# Patient Record
Sex: Male | Born: 1955 | Race: White | Hispanic: No | Marital: Single | Smoking: Current every day smoker
Health system: Southern US, Community
[De-identification: ages and names within clinical notes are randomized; demographics above are authoritative.]

## PROBLEM LIST (undated history)

## (undated) DIAGNOSIS — I2510 Atherosclerotic heart disease of native coronary artery without angina pectoris: Secondary | ICD-10-CM

## (undated) DIAGNOSIS — I10 Essential (primary) hypertension: Secondary | ICD-10-CM

## (undated) DIAGNOSIS — I219 Acute myocardial infarction, unspecified: Secondary | ICD-10-CM

## (undated) DIAGNOSIS — E7800 Pure hypercholesterolemia, unspecified: Secondary | ICD-10-CM

## (undated) HISTORY — PX: CORONARY ANGIOPLASTY WITH STENT PLACEMENT: SHX49

## (undated) HISTORY — PX: CARDIAC CATHETERIZATION: SHX172

---

## 2002-07-06 ENCOUNTER — Encounter: Payer: Self-pay | Admitting: Emergency Medicine

## 2002-07-06 ENCOUNTER — Emergency Department (HOSPITAL_COMMUNITY): Admission: EM | Admit: 2002-07-06 | Discharge: 2002-07-06 | Payer: Self-pay | Admitting: Emergency Medicine

## 2005-09-18 ENCOUNTER — Ambulatory Visit: Payer: Self-pay | Admitting: Cardiology

## 2005-09-18 ENCOUNTER — Inpatient Hospital Stay (HOSPITAL_COMMUNITY): Admission: EM | Admit: 2005-09-18 | Discharge: 2005-09-24 | Payer: Self-pay | Admitting: *Deleted

## 2005-10-16 ENCOUNTER — Ambulatory Visit: Payer: Self-pay | Admitting: Cardiology

## 2005-10-17 ENCOUNTER — Ambulatory Visit: Payer: Self-pay | Admitting: Cardiology

## 2005-10-17 ENCOUNTER — Ambulatory Visit: Payer: Self-pay

## 2005-11-08 ENCOUNTER — Ambulatory Visit: Payer: Self-pay | Admitting: Cardiology

## 2005-12-02 ENCOUNTER — Ambulatory Visit: Payer: Self-pay

## 2005-12-18 ENCOUNTER — Ambulatory Visit: Payer: Self-pay | Admitting: Cardiology

## 2006-07-02 ENCOUNTER — Ambulatory Visit: Payer: Self-pay | Admitting: Cardiology

## 2006-08-27 ENCOUNTER — Ambulatory Visit: Payer: Self-pay | Admitting: Cardiology

## 2010-08-21 NOTE — Assessment & Plan Note (Signed)
[name]                            CARDIOLOGY OFFICE NOTE   NAME:[name], [name]                            MRN:          [id number]  DATE:08/27/2006                            DOB:          02-Apr-1956    [name] is a pleasant gentleman who has a history of coronary disease.  He has had previous PCI of the circumflex, ramus intermedius and LAD.  This occurred in June of 2007.  Since I last saw him, he is doing well  with no dyspnea, chest pain, palpitations, or syncope.  He does continue  to smoke.  He does not exercise routinely and is not clearly following a  diet.   MEDICATIONS:  1. Aspirin 325 mg p.o. daily.  2. Plavix 75 mg p.o. daily.  3. Zantac.  4. Lipitor 40 mg p.o. daily.  5. Lisinopril 40 mg p.o. daily.  6. Toprol 50 mg p.o. daily.   PHYSICAL EXAM:  Blood pressure 161/97, pulse 77.  He weighs 183 pounds.  HEENT:  Normal.  NECK:  Supple.  CHEST:  Clear.  CARDIOVASCULAR:  Regular rate and rhythm.  ABDOMEN:  Benign.  EXTREMITIES:  No edema.   DIAGNOSES:  1. Coronary artery disease status post multivessel percutaneous      coronary intervention - we will continue with his aspirin and      Plavix, as well as his Statin, beta blocker, and ACE inhibitor.  2. Tobacco abuse - we again discussed the importance of discontinuing      this.  3. Hypertension - his blood pressure is elevated today and I have      asked him to being Norvasc 5 mg p.o. daily in addition to his other      medications.  We will increase this as needed.  4. Hyperlipidemia - we will have his most recent lipids and liver      forwarded to [place] from Dr. Landry [name] office so that we can adjust his      medications.  Our goal LDL should be less than 70 given his history      of coronary artery disease.  We will also have his most recent BMET      forwarded to [place] from Dr. Landry [name] office so that we can follow his      potassium and renal function, given his diuretics and ACE  inhibitor      use.  5. Risk factor modification - we discussed the importance of diet and      exercise.   I will see him back in 3 months for blood pressure check.     [name] Frieze [name] Som, MD, [facility]  Electronically Signed    BSC/MedQ  DD: 08/27/2006  DT: 08/27/2006  Job #: [id number]   cc:   [name]

## 2010-08-24 NOTE — Assessment & Plan Note (Signed)
[facility]                              CARDIOLOGY OFFICE NOTE   NAME:Spong, Payton                            MRN:          [id number]  DATE:11/08/2005                            DOB:          10/26/1955    Mr. Seoane is a pleasant 55 year old gentleman who recently had multi-vessel  PCI. Since I last saw him he is having episodes of weakness and dizziness  at times that he attributes to low blood pressure.  He did have his blood  pressure checked at work when one of these episodes happened and his  systolic blood pressure was 98.  He has not had other neurological symptoms  nor has he had chest pain, shortness of breath or syncope.  He does complain  of some pain in his calves with ambulation since beginning his exercise  program.   MEDICATIONS:  Aspirin 325 mg p.o. daily, Plavix 75 mg p.o. daily, Zantac,  Lipitor 40 mg daily, Lisinopril 40 mg p.o. daily, hydrochlorothiazide 12.5  mg p.o. daily, Toprol 75 mg p.o. daily and potassium 10 mEq p.o. daily.   PHYSICAL EXAMINATION:  VITAL SIGNS:  Blood pressure 112/68, pulse is 75. He  weighs 191 pounds.  NECK:  Supple.  CHEST:  Clear.  CARDIOVASCULAR EXAM:  Regular rate and rhythm.  EXTREMITIES:  No edema.   His carotid Doppler's from October 17, 2005 showed no significant stenosis  bilaterally.   DISCHARGE DIAGNOSES:  1.  Coronary artery disease status post recent subendocardial myocardial      infarction with percutaneous coronary intervention of the obtuse      marginal, ramus intermedius and left anterior descending.  2.  Tobacco abuse.  3.  Hypertension.  4.  Hyperlipidemia.   PLAN:  Mr. Minion is complaining of dizziness and low blood pressure. I have  asked him to decrease his Toprol to 50 mg p.o. daily (this should help with  his blood pressure and also he is complaining of some fatigue which may be  related to his beta blockade).  We will also discontinue his  hydrochlorothiazide and  potassium.   I will see him back in 4 weeks and we will adjust his regimen further if  needed. I would like to continue some beta blockade at this point given his  recent subendocardial myocardial infarction.  I will also schedule him to  have ABIs as he is complaining of pain in his calves with ambulation.  Finally, I had a long discussion with him today concerning risk factor  modification including discontinuing his tobacco use. He will see [place] back in  4 weeks.                              Madolyn Frieze Jens Som, MD, [facility]    BSC/MedQ  DD:  11/08/2005  DT:  11/08/2005  Job #:  [id number]   cc:   Dr. Tresa Endo in [place]

## 2010-08-24 NOTE — Assessment & Plan Note (Signed)
[facility]                              CARDIOLOGY OFFICE NOTE   NAME:[name], [name]                            MRN:          [id number]  DATE:10/16/2005                            DOB:          06-09-1955    [name] is a 55 year old gentleman who was recently admitted to [facility] with chest pain.  He ruled in for myocardial infarction with serial  enzymes.  He subsequently underwent cardiac catheterization and was found to  have significant 3-vessel coronary disease with the culprit lesion being a  high-grade lesion in the first obtuse marginal.  The patient had a bare  metal stent of the obtuse marginal.  Later during the hospitalization, he  had staged procedures with stenting to the ramus intermedius and to the mid  LAD.  Since discharge, he has done well with no dyspnea, chest pain, or  pedal edema.  He has had brief episodes of dizziness but no syncope.  This  is not positional.  This morning he had transient haziness in his left eye,  but there were no other neurological symptoms including no loss of strength  or sensation in his extremities and no dysarthria.   MEDICATIONS:  1.  Aspirin 325 mg p.o. daily.  2.  Plavix 75 mg p.o. daily.  3.  Zantac over-the-counter.  4.  Lipitor 40 mg daily.  5.  Lisinopril 40 mg p.o. daily.  6.  Hydrochlorothiazide 12.5 mg p.o. daily.  7.  Toprol 75 mg p.o. daily.  8.  Potassium 10 mEq p.o. daily.   PHYSICAL EXAMINATION:  VITAL SIGNS:  Blood pressure 115/67, pulse 78.  He  weighs 188 pounds.  NECK:  Supple, and I cannot appreciate bruits.  CHEST: Clear.  CARDIOVASCULAR: Regular rate and rhythm.  ABDOMEN:  His groin shows no hematoma and no bruit bilaterally.  I cannot  appreciate a pulsatile mass or bruit on his abdominal exam.  EXTREMITIES:  No edema.   Electrocardiogram shows a sinus rhythm at a rate of approximately 75.  The  axis is normal.  There are no ST changes noted.    DIAGNOSES:  1.  Coronary artery disease status post recent subendocardial myocardial      infarction with percutaneous coronary intervention of the obtuse      marginal and subsequent bare metal stenting of the ramus intermedius and      left anterior descending.  2.  Preserved left ventricular function.  3.  Tobacco abuse.  4.  Hypertension.  5.  Hyperlipidemia.   PLAN:  [name] is doing well from a symptomatic standpoint.  We will  continue with his aspirin, and his Plavix will need to be continued for at  least a year.  He was recently started on Lipitor. We will plan to check  lipids and liver and adjust with a goal LDL of less than 70 given his now  documented coronary artery disease.  He had transient haziness in his left  eye of uncertain etiology.  I will schedule him for carotid Dopplers to  exclude cerebrovascular disease.  We discussed the importance of  discontinuing tobacco use, and we provided a prescription for Chantix today.  He has begun an exercise program, and we will continue with that.  He will  also continue with a low-cholesterol diet.  We will see him back in  approximately 6 months.                              [name] [name] Som, MD, [facility]    BSC/MedQ  DD:  10/16/2005  DT:  10/16/2005  Job #:  [id number]   cc:   [name], M.D.

## 2010-08-24 NOTE — Discharge Summary (Signed)
[name]                   ACCOUNT NO.:  [id number]   MEDICAL RECORD NO.:  [id number]          PATIENT TYPE:  INP   LOCATION:  6525                         FACILITY:  [facility]   PHYSICIAN:  Arturo Morton. Riley Kill, M.D. [facility] OF BIRTH:  Sep 08, 1955   DATE OF ADMISSION:  09/18/2005  DATE OF DISCHARGE:  09/24/2005                                 DISCHARGE SUMMARY   PRINCIPAL DIAGNOSIS:  1.  Non-ST elevation myocardial infarction.  2.  Three-vessel coronary artery disease.      1.  Status post bare metal stenting second obtuse marginal artery with          staged bare metal stenting of ramus intermedius and left anterior          descending.      2.  Normal left ventricular function.  3.  Status post non-sustained ventricular tachycardia.  4.  Question of obstructive sleep apnea.  5.  Mixed dyslipidemia.  6.  Transient hypokalemia.   SECONDARY DIAGNOSES:  1.  Hypertension.  2.  History of medication noncompliance.   PROCEDURES:  1.  Diagnostic coronary angiography by Dr. Nicholes Mango on June 14.  2.  Bare metal stenting of the second obtuse marginal on June 14 and staged      bare metal stenting of ramus intermedius and left anterior descending      artery on June 18, both procedures by Dr. Bonnee Quin.   REASON FOR ADMISSION:  [name] is a 55 year old male with no known history  of coronary artery disease but with several cardiac risk factors, who  initially presented to the emergency room with complaint of new onset chest  discomfort with no associated acute abnormalities on electrocardiogram.  The  patient was admitted for further evaluation and management of symptoms  worrisome for unstable angina pectoris.   HOSPITAL COURSE:  Serial cardiac markers were abnormal and consistent with  non-ST elevation myocardial infarction with a peak troponin of 4.96.  The  patient underwent initial diagnostic coronary angiography on June 14 by Dr.  Gala Romney (see report for details)  revealing diffuse three-vessel coronary  artery disease with high grade culprit lesion in the first obtuse marginal  artery.  Left ventricular function was normal.  Distal aortogram revealed no  significant atherosclerosis.   The patient underwent same day percutaneous intervention by Dr. Bonnee Quin  with bare metal stenting of a high grade second obtuse marginal artery with  no noted complications.  He then returned four days later for staged of bare  metal stenting of high grade stenoses of the ramus intermedius and mid left  anterior descending artery, again with no noted complications.   His hospital course was notable for transient asymptomatic nonsustained  ventricular tachycardia on hospital day two.  Mild hypokalemia was repleted  and normalized prior to discharge.  The patient was referred for smoking  cessation consultation and was also started on a nicotine patch.  The  patient was cleared for discharge on hospital day six in hemodynamically  stable condition.   DISCHARGE LABS:  WBC 10, hemoglobin  13.9, platelet 209.  Sodium 140,  potassium 3.6, BUN 11, creatinine 1.4 and glucose 144.  Outstanding labs  peak CPK 285/36; 12.6%; peak troponin I 4.96.  Lipid profile with total  cholesterol 179, triglyceride 88, HDL 28 and LDL 133.  Hemoglobin/hematocrit  and platelets remained normal throughout.  Liver enzymes remained. Normal  hemoglobin A1c 5.8. TSH 0.83.  Admission chest x-ray showed mild  cardiomegaly with no acute disease   DISPOSITION:  Stable.   DURATION OF DISCHARGE DICTATION:  50 minutes.   INSTRUCTIONS:  1.  The patient is referred to the cardiac catheterization discharge sheet      for full instructions.  2.  The patient is instructed to refrain from driving for two weeks.  3.  Smoking cessation is strongly advised.  4.  The patient is to not return to work pending scheduled for follow-up.   DISCHARGE MEDICATIONS:  Plavix 75 mg daily, coated aspirin 325 mg  daily,  Lipitor 40 mg daily, lisinopril 40 mg daily, nicotine transdermal system 21  mg as directed, hydrochloride 12.5 daily, Toprol XL 75 mg daily, K-Dur 10  mEq daily, Nitrostat 0.4 mg as directed.   FOLLOW-UP:  The patient will establish with Dr. Olga Millers at the  [facility] in [place] on July 11 at 2:30 p.m.      Gene Serpe, P.A. [facility]      Thomas D. Riley Kill, M.D. [facility]  Electronically Signed    GS/MEDQ  D:  09/24/2005  T:  09/24/2005  Job:  [id number]   cc:   Olivia Canter, M.D.

## 2010-08-24 NOTE — H&P (Signed)
NAMELASHAWN, ORREGO                   ACCOUNT NO.:  [id number]   MEDICAL RECORD NO.:  [id number]          PATIENT TYPE:  EMS   LOCATION:  MAJO                         FACILITY:  [facility]   PHYSICIAN:  Jonelle Sidle, M.D. LHCDATE OF BIRTH:  1956-01-10   DATE OF ADMISSION:  09/18/2005  DATE OF DISCHARGE:                                HISTORY & PHYSICAL   PRIMARY CARE PHYSICIAN:  Dr. Olivia Canter in [place].   Mr. Homewood is a 55 year old Caucasian gentleman with no known history of  coronary artery disease; however, with a history of poorly controlled  hypertension, who presents today with an episode of chest discomfort.  The  chest discomfort initially started around 1:15 today.  Patient states that  he was at work.  He works for [employer].  He had a sudden  onset of tightness, substernal, localized.  He states that he was sitting at  that time.  He got up to get some water to drink.  The chest tightness then  proceeded up into his neck and throat area.  He states that he feels he  could not swallow.  He said the pressure was very intense.  It then radiated  down both arms.  He denies any shortness of breath.  He did become  lightheaded.  He states that he felt very flushed.  He had some blurred  vision with it.  He denied any nausea, vomiting, or diaphoresis.  He states  that it lasted approximately five minutes.  The chest pain eased off, but  the neck pain continued.  He states that he drank some more water when the  chest pain returned.  He then went and saw the nurse at work, and she called  911.  He states that at the worst, the chest discomfort was around 6-7.  He  currently is rating it around 3 or 4.  Currently, the patient without any  acute ST/T wave changes on the EKG; however, blood pressure at this time is  177/110.   ALLERGIES:  PENICILLIN.   MEDICATIONS:  Patient states that when he remembers to take it, he is  supposed to be on 10 mg of  lisinopril.  He takes Zantac over the counter.   PAST MEDICAL HISTORY:  1.  Hypertension x3 years.  2.  Borderline cholesterol.  3.  Noncompliance with medications.  4.  Questionable obstructive sleep apnea, per wife's description.  5.  Patient states that he had a stress test a few years ago.  Patient      reports it was negative.   SOCIAL HISTORY:  He lives in [place] with his wife.  He works for  [employer].  He has ongoing tobacco use of one pack per day  x30+ years.  No exercise.  Denies ETOH, drug, or over-medication use.  He  follows a regular diet.  Wife states he has been told to follow a low sodium  diet, but patient is not compliant.   FAMILY HISTORY:  Mother deceased at age 28 with history of COPD and  asthma.  Father had his first MI at age 63.  Since that time, he has had four more  heart attacks, the last one being when he was 55 years old and cause of  death.  He had two strokes.  He had a bypass and then had a redo bypass.  Siblings:  He has a brother who deceased at age 35 secondary to MI and a  brother deceased at age 80 secondary to an aneurysm.   REVIEW OF SYSTEMS:  Positive for headache, blurred vision, chest pain, heavy  snoring, and periods of apnea, per patient's wife.  All other systems are  negative, per patient.  CARDIOPULMONARY:  Positive for claudication  symptoms.   PHYSICAL EXAMINATION:  VITAL SIGNS:  Temp 98.7, pulse 73, respirations 20,  blood pressure 193/104.  Currently, he is 177/110.  GENERAL:  He is in no acute distress.  He is very calm and cooperative.  HEENT:  Pupils are equal, round and reactive to light.  Sclerae are clear.  Normocephalic and atraumatic.  NECK:  Supple without lymphadenopathy, bruits, or JVD.  LUNGS:  He has rhonchi in the right lobes that clear with cough.  Otherwise  decreased breath sounds throughout.  CARDIOVASCULAR:  Regular rhythm.  S1 and S2.  Pulses are 2+ and equal  without bruit.  ABDOMEN:   Soft and nontender.  Positive bowel sounds.  EXTREMITIES:  Without clubbing, cyanosis or edema.  He has 2+ DP's  bilaterally.  NEUROLOGIC:  Patient is alert and oriented x3.  Cranial nerves II-XII  grossly intact.   Chest x-ray showing cardiomegaly.   EKG at a rate of 70, sinus rhythm.  Patient has nonspecific T wave  abnormalities in the inferior lateral leads, very subtle ST depression.   LABORATORY WORK:  WBC 10.7, hemoglobin 15.5, hematocrit 44.6 with a platelet  count of 197,000.  Sodium 141, potassium 3.3, chloride 104, CO2 27, BUN 10,  creatinine 1.3 with a glucose of 106.  AST 14, ALT 13.  Cardiac markers:  Troponin less than 0.05.   Dr. Simona Huh in to examine this nice patient with chest pain in the  setting of hypertensive urgency and hypokalemia with ongoing tobacco abuse.  Patient will be admitted to step-down.  Continue treatment with aspirin,  nitroglycerin IV, beta blocker.  We will continue his ACE inhibitor,  increase the dose from 10-20 and will give patient a one time dose of 20 mg  of hydralazine here in the ER.  No anticoagulation until blood pressure more  stable.  We will cycle cardiac enzymes, check hemoglobin A1C and a lipid  panel.  The patient will need a smoking cessation consult.  Anticipate  diagnostic cardiac catheterization in the a.m.  The risks and benefits have  been discussed with the patient, and patient agrees to proceed with cardiac  catheterization.      Dorian Pod, NP      Jonelle Sidle, M.D. [facility]  Electronically Signed    MB/MEDQ  D:  09/18/2005  T:  09/18/2005  Job:  [id number]

## 2010-08-24 NOTE — Assessment & Plan Note (Signed)
[facility]                              CARDIOLOGY OFFICE NOTE   NAME:Cadieux, Tyri                            MRN:          [id number]  DATE:12/18/2005                            DOB:          1955-10-08    Mr. Shin is a very pleasant gentleman with a history of coronary disease,  status post multivessel PCI.  When I last saw him he was having episodes of  weakness and his blood pressure was running low.  We therefore discontinued  his hydrochlorothiazide and decreased his Toprol to 50 mg daily.  Since then  he feels much better.  He denies any dyspnea, chest pain or syncope.  Also  note his ABIs were normal.   MEDICATIONS:  1. Aspirin 325 mg two daily.  2. Plavix 75 mg q. day.  3. Zantac over-the-counter.  4. Lipitor 40 mg q. day.  5. Lisinopril 40 mg q. day.  6. Toprol 50 mg q. day.   PHYSICAL EXAMINATION:  VITAL SIGNS:  Today shows a blood pressure 145/86,  his pulse is 76.  He weighs 190 pounds.  NECK:  Is supple with no bruits.  CHEST:  Clear.  CARDIAC:  Shows a regular rate and rhythm.  EXTREMITIES:  Show no edema.   DIAGNOSES:  1. Coronary artery disease - status post multivessel PCI.  2. Tobacco abuse.  3. Hypertension.  4. Hyperlipidemia.   PLAN:  1. Mr. Devery is doing well from a symptomatic standpoint.  His blood      pressure is mildly increased today but we will not make changes as he      had problems with presyncope previously.  2. We again discussed the importance of discontinuing his tobacco use.  3. He will continue with his diet and exercise program.  4. Note his most recent HDL was 24 but he did not tolerate Niaspan.  5. He will see me back in 6 months.                              Madolyn Frieze Jens Som, MD, [facility]    BSC/MedQ  DD:  12/18/2005  DT:  12/19/2005  Job #:  [id number]   cc:   Pearson Forster, MD

## 2010-08-24 NOTE — Cardiovascular Report (Signed)
[name], [name]                   ACCOUNT NO.:  [id number]   MEDICAL RECORD NO.:  [id number]          PATIENT TYPE:  INP   LOCATION:  2920                         FACILITY:  [facility]   PHYSICIAN:  Arvilla Meres, M.D. LHCDATE OF BIRTH:  Aug 29, 1955   DATE OF PROCEDURE:  09/19/2005  DATE OF DISCHARGE:                              CARDIAC CATHETERIZATION   PRIMARY CARE PHYSICIAN:  Dr. Tresa Endo in [place].   CARDIOLOGIST:  Dr. Simona Huh.   PATIENT IDENTIFICATION:  [name] is a 55 year old male with no known  history of coronary disease.  He does have a history of tobacco use which is  ongoing as well as hypertension.  He was admitted with non-ST elevation  myocardial infarction and brought to the cath lab for diagnostic  angiography.   PROCEDURES PERFORMED:  1.  Selective coronary angiography.  2.  Left heart cath.  3.  Left ventriculogram.  4.  Abdominal aortogram.   DESCRIPTION OF PROCEDURE:  The risks and benefits of catheterization were  explained.  Consent was signed and placed on the chart.  A 6-French arterial  sheath was placed in the right femoral artery using a modified Seldinger  technique.  Standard preformed catheters including a Judkins JL-4, JR-4 and  angled pigtail were used for the catheterization.  All catheter exchanges  were made over a wire.  There are no apparent complications.   FINDINGS:   HEMODYNAMIC RESULTS:  Central aortic pressure is 143/88 with a mean of 112.  LV pressure is 157/10 with an LVEDP of 18.  There is no significant aortic  stenosis.   Left main was short, angiographically normal.   LAD was a long vessel that wrapped the apex.  It had moderate diffuse  disease throughout with heavy calcification proximally.  It gave off a large  branching diagonal in the proximal portion.  In the proximal to midportion  of the LAD just after the takeoff of the diagonal there is a tubular 60-70%  stenosis.  Following this, there is a 40% lesion in  the midsection.  The  distal LAD had moderate diffuse disease with no high-grade lesions.  In the  diagonal there was a 60% lesion in the midsection.   Left circumflex was a large codominant system.  It gave off  a large  branching ramus, a large OM-1, 2 small PLs and posterolaterals and a small  PDA.  There was a 30-40% lesion of the mid AV groove circ.  In the ramus  there was a 30% lesion proximally followed by a 60-70% lesion in the  midsection and a 40% lesion more distally.  In the OM-1 there is a 60%  lesion proximally and 90% hazy lesion in the mid OM-1.   The right coronary artery was a moderate size codominant vessel, gave off a  large RV branch and a moderate-sized PDA.  Once again the RCA had diffuse  moderate disease.  There is a 30% lesion proximally, a 50% lesion in the  midsection just after the takeoff of the RV branch and a 40% lesion distally  prior to the takeoff of the PDA.   Left ventriculogram done in the RAO position showed an ejection fraction of  65-70% with no regional wall motion abnormalities.  No mitral regurgitation.   Abdominal aortogram showed patent renal arteries bilaterally with moderate  aortic plaquing throughout the aortic iliac plaquing throughout.  There is  no aneurysm.   ASSESSMENT:  1.  Diffuse moderate three-vessel coronary artery disease with high-grade      culprit lesion in the obtuse marginal one.  2.  Normal left ventricular function.  3.  Severe hypertension on admission with patent renal arteries bilaterally.   PLAN/DISCUSSION:  I have reviewed the films with Dr. Riley Kill.  The plan will  be for percutaneous intervention on the OM-1 today.  We will consider  outpatient Myoview to this assess the hemodynamic significance of the  proximal mid LAD lesion.  He will obviously need aggressive risk factor  management including high dose statin and smoking cessation.      Arvilla Meres, M.D. [facility]  Electronically Signed      DB/MEDQ  D:  09/19/2005  T:  09/19/2005  Job:  [id number]   cc:   Tresa Endo, M.D.  Kathryne Sharper

## 2010-08-24 NOTE — Cardiovascular Report (Signed)
[name], [name]                   ACCOUNT NO.:  [id number]   MEDICAL RECORD NO.:  [id number]          PATIENT TYPE:  INP   LOCATION:  2920                         FACILITY:  [facility]   PHYSICIAN:  Arturo Morton. Riley Kill, M.D. [facility] OF BIRTH:  09-15-1955   DATE OF PROCEDURE:  09/19/2005  DATE OF DISCHARGE:                              CARDIAC CATHETERIZATION   INDICATIONS:  The patient is a 55 year old who presents with unstable  angina.  He underwent diagnostic catheterization demonstrating high-grade  stenosis in the OM-2.  There is a moderately high-grade stenosis in the OM1  and a moderate stenosis in the left anterior descending artery.  After  considerable discussion, we elected to recommend percutaneous intervention  of the OM-2.  This was explained to the patient and his family.  Preparations were then made for percutaneous intervention.   DESCRIPTION OF PROCEDURE:  The patient was brought to the catheterization  laboratory and prepped and draped in usual fashion.  There was an indwelling  6-French sheath.  This was exchanged for a 7-French sheath.  Bivalirudin was  given according to protocol; because of hypertension, 20 mg of labetalol was  administered.  The patient was also given intracoronary nitroglycerin.  We  used a CLS 3.5 guide to enter the left main with adequate anticoagulation  documented by ACT, a Prowater wire was placed down into the distal  circumflex and predilatation done with a 2.25 x 12 Maverick balloon.  We  then stented the lesion with 2.5 x 15 mini Vision stent.  This was taken up  to about 12 atmospheres, then post dilated.  There was an area of disease  proximal to the stent at the entry into the OM II, and after considerable  review of the studies, we elected to place an overlapping stent and  therefore a 2.5 x 23 mini Vision stent was then placed in overlapping  fashion and taken up to 14 atmospheres.  Post dilatation was then done with  a 2.75-mm  Quantum Maverick balloon with marked improvement in the appearance  of the artery.  With the vasodilatation associated with the intracoronary  nitroglycerin, also the intermediate and LAD lesions were reviewed.  I plan  to review these with Dr. Gala Romney in detail after the patient is  stabilized.  They may require later percutaneous intervention.   ANGIOGRAPHIC DATA:  The left main is free of critical disease.  The  circumflex provides an OM1 and OM2 and an AV circumflex resulting a  posterolateral branch.  The OM1 has segmental 70-80% area of disease that is  fairly smooth.  The OM2 has an 80% stenosis distally and then a probably 70-  80% stenosis proximally.  After stenting, there was marked improvement in  the appearance of the this segmental area with 38 mm of overlapping stent.  The OM1 and LAD appear unchanged.   CONCLUSION:  Successful percutaneous stenting of a OM2.   DISPOSITION:  The patient will be treated with aspirin and Plavix.  I will  review with my colleagues the status of the OM1, and  we may elect a  percutaneous intervention of this vessel as well.      Arturo Morton. Riley Kill, M.D. [facility]  Electronically Signed     TDS/MEDQ  D:  09/19/2005  T:  09/19/2005  Job:  [id number]   cc:   Arvilla Meres, M.D. [facility]  [employer]  [address]  [place]  [place] [address]

## 2010-08-24 NOTE — Assessment & Plan Note (Signed)
[facility]                            CARDIOLOGY OFFICE NOTE   NAME:[name]                            MRN:          [id number]  DATE:07/02/2006                            DOB:          Sep 02, 1955    [name] returns for followup today.  Since I last saw him he denies any  dyspnea, chest pain, palpitations, or syncope and there is no pedal  edema.  He is not exercising routinely, nor has he quit smoking.   MEDICATIONS:  1. Aspirin 325 mg p.o. daily.  2. Plavix 75 mg p.o. daily.  3. Zantac.  4. Lipitor 40 mg p.o. daily.  5. Lisinopril 40 mg p.o. daily.  6. Toprol 50 mg p.o. daily.   PHYSICAL EXAMINATION:  Shows a blood pressure 160/94 and his pulse is  72.  NECK:  Supple with no bruits.  CHEST:  Clear.  CARDIOVASCULAR:  Regular rate and rhythm.  EXTREMITIES:  Show no edema.  ELECTROCARDIOGRAM:  Shows a sinus rhythm at a rate of 70.  There are no  ST changes noted.   DIAGNOSIS:  1. Coronary artery disease, status post multi-vessel percutaneous      coronary intervention.  2. Tobacco abuse.  3. Hypertension.  4. Hyperlipidemia.   PLAN:  [name] is doing well from a symptomatic standpoint.  His blood  pressure is elevated today and I have asked him to resume his HCTZ at  12.5 mg p.o. daily.  We will check a BMET in one week to follow his  potassium and renal function.  At that time we will check lipids and  liver as well.  We will adjust his regimen as indicated.  We discussed  the importance of discontinuing his tobacco use as well as diet and  exercise.  I will see him back in 8 weeks to check his blood pressure.     Madolyn Frieze Jens Som, MD, [facility]  Electronically Signed    BSC/MedQ  DD: 07/02/2006  DT: 07/02/2006  Job #: [id number]   cc:   Olivia Canter, MD

## 2010-08-24 NOTE — Cardiovascular Report (Signed)
[name], APOLLO                   ACCOUNT NO.:  [id number]   MEDICAL RECORD NO.:  [id number]          PATIENT TYPE:  INP   LOCATION:  6525                         FACILITY:  [facility]   PHYSICIAN:  Arturo Morton. Riley Kill, M.D. [facility] OF BIRTH:  08-Dec-1955   DATE OF PROCEDURE:  09/23/2005  DATE OF DISCHARGE:                              CARDIAC CATHETERIZATION   INDICATIONS:  The patient is a 55 year old gentleman who presented with a  non-ST-elevation myocardial infarction.  He underwent diagnostic  catheterization by Dr. Gala Romney who thought that the AV circumflex was  likely the source of the ischemia.  He underwent percutaneous stenting with  overlapping non drug-eluting stents.  The patient has a history of polyps  and has had resection of polyps with some GI bleeding and two colonoscopies  in the past couple of years.  He was brought back to the catheterization  laboratory today for a relook and possible percutaneous intervention of the  intermediate and possibly the LAD.  This been discussed with the patient in  detail.  He consented to proceed.   PROCEDURES:  1.  Percutaneous stenting of the ramus intermedius for obtuse marginal-1  2.  Percutaneous stenting of the left anterior descending.   DESCRIPTION OF PROCEDURE:  The patient was brought to the catheterization  laboratory, prepped and draped in usual fashion.  Through an anterior  puncture the left femoral artery was entered a 6-French sheath was placed.  We then did a diagnostic view of the previous stent procedure and this was  widely patent and appeared to be smooth.  Additional oral clopidogrel was  administered.  Bivalirudin was given according to protocol.  ACT was checked  and found to be appropriate.  Following this, a high-torque, floppy wire was  placed down the intermediate vessel.  We carefully measured the lesion  length.  Following this, a 2.5 x 8 mini-Vision stent was placed across the  stenosis and taken up to  a minimum of 14 atmospheres.  Post dilatation was  then done with a 3-0 Quantum Maverick balloon.  There was marked improvement  in the appearance of the artery.  We then turned our attention to the LAD.  After considerable thought, it was my feeling that we should go ahead and  potentially image the LAD.  We had a difficult time getting a good IVUS  image, so we went ahead and predilated the LAD using a 2.25 x 15 Maverick  balloon and there was full expansion of the balloon.  We then placed a 2.25  x 23 mini-Vision stent and this was also deployed at about 14 atmospheres.  A2.5 Quantum Maverick balloon was then used to post dilate the stent and  there was marked improvement in the appearance of the artery.  Final  angiographic views were obtained. Because of some oozing around the left  groin and persistent hypotension despite intravenous nitroglycerin, giving  the patient his medicines, and additional labetalol in the laboratory, there  was some mild continuous oozing so we placed a 7-French sheath  in exchange  for the 6-French  sheath.  He was taken to the holding area in satisfactory  clinical condition.   ANGIOGRAPHIC DATA:  1.  The left main had some mild calcification, but is widely patent.  2.  The previously treated circumflex has about 30% narrowing throughout the      proximal vessel and leads into an OM-2.  This vessel was widely patent      with good angiographic runoff into the distal artery.  There are      overlapping stents.  3.  The ramus intermedius has an 80-90% area of stenosis leading into a      large vessel.  The vessel itself is diffusely diseased with segmental      plaquing proximally.  Following stenting, this is reduced to 0% residual      luminal narrowing across this area.  4.  The LAD has about 30% narrowing prior to the diagonal.  The diagonal      probably has about 60-70% narrowing.  Just after the diagonal, is an      area of segmental plaquing with  some calcification that is eccentric.      The most serious area of plaque does not have significant calcification      angiographically.  Following stenting, this 80-90% area of stenosis is      reduced to 0% with an excellent angiographic appearance.  There is about      30-40% narrowing distal to the stent site and multiple areas of luminal      irregularity distally.   CONCLUSION:  1.  Continued patency of the obtuse marginal stent.  2.  Successful percutaneous stenting of the ramus intermedius.  3.  Successful percutaneous stenting of the left anterior descending with      residual disease of the diagonal.   DISPOSITION:  The patient be treated medically.  Significant risk factor  reduction will be important.  He will need follow-up and this can be either  arranged in [facility] or in the [facility] in  [place].  Plavix will be needed for a minimum of a month and preferably  x1 year.      Arturo Morton. Riley Kill, M.D. [facility]  Electronically Signed     TDS/MEDQ  D:  09/23/2005  T:  09/24/2005  Job:  [id number]   cc:   Olivia Canter, M.D.

## 2013-06-16 ENCOUNTER — Institutional Professional Consult (permissible substitution): Payer: Self-pay | Admitting: Cardiology

## 2013-10-02 ENCOUNTER — Emergency Department (HOSPITAL_COMMUNITY): Payer: No Typology Code available for payment source

## 2013-10-02 ENCOUNTER — Encounter (HOSPITAL_COMMUNITY): Payer: Self-pay | Admitting: Emergency Medicine

## 2013-10-02 ENCOUNTER — Inpatient Hospital Stay (HOSPITAL_COMMUNITY)
Admission: EM | Admit: 2013-10-02 | Discharge: 2013-10-03 | DRG: 087 | Disposition: A | Discharge status: Home / Self Care | Payer: No Typology Code available for payment source | Attending: Surgery | Admitting: Surgery

## 2013-10-02 DIAGNOSIS — S0990XA Unspecified injury of head, initial encounter: Secondary | ICD-10-CM

## 2013-10-02 DIAGNOSIS — S065XAA Traumatic subdural hemorrhage with loss of consciousness status unknown, initial encounter: Secondary | ICD-10-CM

## 2013-10-02 DIAGNOSIS — I10 Essential (primary) hypertension: Secondary | ICD-10-CM | POA: Diagnosis present

## 2013-10-02 DIAGNOSIS — S8012XA Contusion of left lower leg, initial encounter: Secondary | ICD-10-CM

## 2013-10-02 DIAGNOSIS — T07XXXA Unspecified multiple injuries, initial encounter: Secondary | ICD-10-CM | POA: Diagnosis present

## 2013-10-02 DIAGNOSIS — Z9861 Coronary angioplasty status: Secondary | ICD-10-CM

## 2013-10-02 DIAGNOSIS — Z7902 Long term (current) use of antithrombotics/antiplatelets: Secondary | ICD-10-CM

## 2013-10-02 DIAGNOSIS — Z79899 Other long term (current) drug therapy: Secondary | ICD-10-CM

## 2013-10-02 DIAGNOSIS — S065X4A Traumatic subdural hemorrhage with loss of consciousness of 6 hours to 24 hours, initial encounter: Secondary | ICD-10-CM

## 2013-10-02 DIAGNOSIS — S0181XA Laceration without foreign body of other part of head, initial encounter: Secondary | ICD-10-CM

## 2013-10-02 DIAGNOSIS — I252 Old myocardial infarction: Secondary | ICD-10-CM

## 2013-10-02 DIAGNOSIS — Z7982 Long term (current) use of aspirin: Secondary | ICD-10-CM

## 2013-10-02 DIAGNOSIS — R911 Solitary pulmonary nodule: Secondary | ICD-10-CM | POA: Diagnosis present

## 2013-10-02 DIAGNOSIS — S065X9A Traumatic subdural hemorrhage with loss of consciousness of unspecified duration, initial encounter: Secondary | ICD-10-CM

## 2013-10-02 DIAGNOSIS — F17200 Nicotine dependence, unspecified, uncomplicated: Secondary | ICD-10-CM | POA: Diagnosis present

## 2013-10-02 DIAGNOSIS — E7800 Pure hypercholesterolemia, unspecified: Secondary | ICD-10-CM | POA: Diagnosis present

## 2013-10-02 DIAGNOSIS — I2510 Atherosclerotic heart disease of native coronary artery without angina pectoris: Secondary | ICD-10-CM | POA: Diagnosis present

## 2013-10-02 HISTORY — DX: Acute myocardial infarction, unspecified: I21.9

## 2013-10-02 HISTORY — DX: Atherosclerotic heart disease of native coronary artery without angina pectoris: I25.10

## 2013-10-02 HISTORY — DX: Essential (primary) hypertension: I10

## 2013-10-02 HISTORY — DX: Pure hypercholesterolemia, unspecified: E78.00

## 2013-10-02 LAB — COMPREHENSIVE METABOLIC PANEL
ALT: 20 U/L (ref 0–53)
AST: 19 U/L (ref 0–37)
Albumin: 3.8 g/dL (ref 3.5–5.2)
Alkaline Phosphatase: 80 U/L (ref 39–117)
BILIRUBIN TOTAL: 0.6 mg/dL (ref 0.3–1.2)
BUN: 18 mg/dL (ref 6–23)
CHLORIDE: 103 meq/L (ref 96–112)
CO2: 26 meq/L (ref 19–32)
Calcium: 9.5 mg/dL (ref 8.4–10.5)
Creatinine, Ser: 1.42 mg/dL — ABNORMAL HIGH (ref 0.50–1.35)
GFR calc Af Amer: 61 mL/min — ABNORMAL LOW (ref >90)
GFR, EST NON AFRICAN AMERICAN: 52 mL/min — AB (ref >90)
Glucose, Bld: 142 mg/dL — ABNORMAL HIGH (ref 70–99)
POTASSIUM: 3.7 meq/L (ref 3.7–5.3)
SODIUM: 144 meq/L (ref 137–147)
Total Protein: 6.8 g/dL (ref 6.0–8.3)

## 2013-10-02 LAB — CBC WITH DIFFERENTIAL/PLATELET
Basophils Absolute: 0 10*3/uL (ref 0.0–0.1)
Basophils Relative: 0 % (ref 0–1)
EOS ABS: 0.2 10*3/uL (ref 0.0–0.7)
EOS PCT: 2 % (ref 0–5)
HEMATOCRIT: 44.1 % (ref 39.0–52.0)
Hemoglobin: 15.4 g/dL (ref 13.0–17.0)
LYMPHS ABS: 2.7 10*3/uL (ref 0.7–4.0)
Lymphocytes Relative: 29 % (ref 12–46)
MCH: 32.5 pg (ref 26.0–34.0)
MCHC: 34.9 g/dL (ref 30.0–36.0)
MCV: 93 fL (ref 78.0–100.0)
MONO ABS: 0.8 10*3/uL (ref 0.1–1.0)
Monocytes Relative: 9 % (ref 3–12)
Neutro Abs: 5.6 10*3/uL (ref 1.7–7.7)
Neutrophils Relative %: 60 % (ref 43–77)
PLATELETS: 151 10*3/uL (ref 150–400)
RBC: 4.74 MIL/uL (ref 4.22–5.81)
RDW: 12.6 % (ref 11.5–15.5)
WBC: 9.4 10*3/uL (ref 4.0–10.5)

## 2013-10-02 LAB — ETHANOL: Alcohol, Ethyl (B): <11 mg/dL (ref 0–11)

## 2013-10-02 LAB — I-STAT CHEM 8, ED
BUN: 18 mg/dL (ref 6–23)
CALCIUM ION: 1.21 mmol/L (ref 1.13–1.30)
CREATININE: 1.5 mg/dL — AB (ref 0.50–1.35)
Chloride: 100 mEq/L (ref 96–112)
GLUCOSE: 139 mg/dL — AB (ref 70–99)
HCT: 46 % (ref 39.0–52.0)
HEMOGLOBIN: 15.6 g/dL (ref 13.0–17.0)
Potassium: 3.5 mEq/L — ABNORMAL LOW (ref 3.7–5.3)
SODIUM: 143 meq/L (ref 137–147)
TCO2: 28 mmol/L (ref 0–100)

## 2013-10-02 LAB — ABO/RH: ABO/RH(D): A POS

## 2013-10-02 LAB — PROTIME-INR
INR: 0.99 (ref 0.00–1.49)
Prothrombin Time: 13.1 seconds (ref 11.6–15.2)

## 2013-10-02 LAB — TYPE AND SCREEN
ABO/RH(D): A POS
ANTIBODY SCREEN: NEGATIVE

## 2013-10-02 LAB — APTT: aPTT: 25 seconds (ref 24–37)

## 2013-10-02 LAB — MRSA PCR SCREENING: MRSA by PCR: NEGATIVE

## 2013-10-02 MED ORDER — IRBESARTAN 150 MG PO TABS
150.0000 mg | ORAL_TABLET | Freq: Every day | ORAL | Status: DC
  Administered 2013-10-02 – 2013-10-03 (×2): 150 mg via ORAL
  Filled 2013-10-02 (×2): qty 1

## 2013-10-02 MED ORDER — FENTANYL CITRATE 0.05 MG/ML IJ SOLN
100.0000 ug | Freq: Once | INTRAMUSCULAR | Status: AC
  Administered 2013-10-02: 100 ug via INTRAVENOUS
  Filled 2013-10-02: qty 2

## 2013-10-02 MED ORDER — PANTOPRAZOLE SODIUM 40 MG IV SOLR: 40.0000 mg | Freq: Every day | INTRAVENOUS | Status: DC

## 2013-10-02 MED ORDER — POTASSIUM CHLORIDE IN NACL 20-0.9 MEQ/L-% IV SOLN
INTRAVENOUS | Status: DC
  Administered 2013-10-02: 06:00:00 via INTRAVENOUS
  Administered 2013-10-03: 1000 mL via INTRAVENOUS
  Filled 2013-10-02 (×5): qty 1000

## 2013-10-02 MED ORDER — OXYCODONE-ACETAMINOPHEN 5-325 MG PO TABS
1.0000 | ORAL_TABLET | ORAL | Status: DC | PRN
  Administered 2013-10-03: 1 via ORAL
  Filled 2013-10-02: qty 1

## 2013-10-02 MED ORDER — ONDANSETRON HCL 4 MG/2ML IJ SOLN: 4.0000 mg | Freq: Four times a day (QID) | INTRAMUSCULAR | Status: DC | PRN

## 2013-10-02 MED ORDER — ONDANSETRON HCL 4 MG PO TABS: 4.0000 mg | ORAL_TABLET | Freq: Four times a day (QID) | ORAL | Status: DC | PRN

## 2013-10-02 MED ORDER — PANTOPRAZOLE SODIUM 40 MG PO TBEC
40.0000 mg | DELAYED_RELEASE_TABLET | Freq: Every day | ORAL | Status: DC
Start: 2013-10-02 — End: 2013-10-03
  Administered 2013-10-02 – 2013-10-03 (×2): 40 mg via ORAL
  Filled 2013-10-02 (×2): qty 1

## 2013-10-02 MED ORDER — MORPHINE SULFATE 2 MG/ML IJ SOLN
2.0000 mg | INTRAMUSCULAR | Status: DC | PRN
  Administered 2013-10-02: 2 mg via INTRAVENOUS
  Filled 2013-10-02: qty 1

## 2013-10-02 MED ORDER — SODIUM CHLORIDE 0.9 % IV SOLN
Freq: Once | INTRAVENOUS | Status: AC
  Administered 2013-10-02: 02:00:00 via INTRAVENOUS

## 2013-10-02 MED ORDER — TETANUS-DIPHTH-ACELL PERTUSSIS 5-2.5-18.5 LF-MCG/0.5 IM SUSP
0.5000 mL | Freq: Once | INTRAMUSCULAR | Status: AC
  Administered 2013-10-02: 0.5 mL via INTRAMUSCULAR
  Filled 2013-10-02: qty 0.5

## 2013-10-02 MED ORDER — IOHEXOL 300 MG/ML  SOLN
100.0000 mL | Freq: Once | INTRAMUSCULAR | Status: AC | PRN
  Administered 2013-10-02: 100 mL via INTRAVENOUS

## 2013-10-02 MED ORDER — NICARDIPINE HCL IN NACL 20-0.86 MG/200ML-% IV SOLN
3.0000 mg/h | Freq: Once | INTRAVENOUS | Status: AC
  Administered 2013-10-02: 5 mg/h via INTRAVENOUS
  Filled 2013-10-02: qty 200

## 2013-10-02 MED ORDER — METOPROLOL SUCCINATE ER 50 MG PO TB24
50.0000 mg | ORAL_TABLET | Freq: Every day | ORAL | Status: DC
  Administered 2013-10-02 – 2013-10-03 (×2): 50 mg via ORAL
  Filled 2013-10-02 (×2): qty 1

## 2013-10-02 NOTE — Progress Notes (Signed)
[facility] (OTS)  Subjective: Feels much better this am  Objective:  Physical Exam Bilateral LE  No traumatic wounds, ecchymosis, or rash  Nontender  No effusions  Knee stable to varus/ valgus and anterior/posterior stress  Sens DPN, SPN, TN intact  Motor EHL, ext, flex, evers 5/5  DP 2+, PT 2+, No significant edema    Imaging No fracture  Assessment/Plan: PT for mobilization; will sign off; please call if questions or additional concerns  Myrene GalasMichael Handy, MD [facility] [phone] (p)

## 2013-10-02 NOTE — Consult Note (Signed)
I saw and examined the patient with Mr. Renae Fickleaul, communicating the findings and plan noted above.  Admission to trauma for subdural monitoring. Will follow when films are complete.  Myrene GalasMichael Handy, MD [facility] [phone] (p)
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
## 2013-10-02 NOTE — Care Management Utilization Note (Signed)
UR Completed.    Julie Wise Amerson, RN, BSN Phone #[phone] 
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
## 2013-10-02 NOTE — ED Notes (Signed)
This RN gave report to the floor, not Isac CaddyMatt Beasley, RN as charted below.

## 2013-10-02 NOTE — ED Notes (Signed)
Family updated as to patient's status. Wife called with patients permission and updated over the phone. [phone], Bonita QuinLinda
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
## 2013-10-03 ENCOUNTER — Inpatient Hospital Stay (HOSPITAL_COMMUNITY): Payer: No Typology Code available for payment source

## 2013-10-03 LAB — BASIC METABOLIC PANEL
BUN: 12 mg/dL (ref 6–23)
CO2: 20 mEq/L (ref 19–32)
CREATININE: 0.96 mg/dL (ref 0.50–1.35)
Calcium: 8.5 mg/dL (ref 8.4–10.5)
Chloride: 106 mEq/L (ref 96–112)
GFR calc non Af Amer: 88 mL/min — ABNORMAL LOW (ref >90)
Glucose, Bld: 147 mg/dL — ABNORMAL HIGH (ref 70–99)
Potassium: 3.7 mEq/L (ref 3.7–5.3)
Sodium: 141 mEq/L (ref 137–147)

## 2013-10-03 LAB — CBC
HCT: 41 % (ref 39.0–52.0)
Hemoglobin: 14.2 g/dL (ref 13.0–17.0)
MCH: 32.4 pg (ref 26.0–34.0)
MCHC: 34.6 g/dL (ref 30.0–36.0)
MCV: 93.6 fL (ref 78.0–100.0)
Platelets: 136 10*3/uL — ABNORMAL LOW (ref 150–400)
RBC: 4.38 MIL/uL (ref 4.22–5.81)
RDW: 12.5 % (ref 11.5–15.5)
WBC: 10.9 10*3/uL — ABNORMAL HIGH (ref 4.0–10.5)
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
## 2013-10-03 NOTE — Discharge Summary (Signed)
Agree with discharge.  To follow up with Dr. Lovell SheehanJenkins.

## 2013-10-03 NOTE — Discharge Instructions (Signed)
Subdural Hematoma  A subdural hematoma is a collection of blood between the brain and its tough outermost membrane covering (the dura).  Blood clots that form in this area push down on the brain and cause irritation. A subdural hematoma may cause parts of the brain to stop working and eventually cause death.   CAUSES  A subdural hematoma is caused by bleeding from a ruptured blood vessel (hemorrhage). The bleeding results from trauma to the head, such as from a fall or motor vehicle accident.  There are two types of subdural hemorrhages:  · Acute. This type develops shortly after a serious blow to the head and causes blood to collect very quickly. If not diagnosed and treated promptly, severe brain injury or death can occur.  · Chronic. This is when bleeding develops more slowly, over weeks or months.  RISK FACTORS  People at risk for subdural hematoma include older persons, infants, and alcoholics.  SYMPTOMS  An acute subdural hemorrhage develops over minutes to hours. Symptoms can include:  · Temporary loss of consciousness.  · Weakness of arms or legs on one side of the body.  · Changes in vision or speech.  · A severe headache.  · Seizures.  · Nausea and vomiting.  · Increased sleepiness.  A chronic subdural hemorrhage develops over weeks to months. Symptoms may develop slowly and produce less noticeable problems or changes. Symptoms include:  · A mild headache.  · A change in personality.  · Loss of balance or difficulty walking.  · Weakness, numbness, or tingling in the arms or legs.  · Nausea or vomiting.  · Memory loss.  · Double vision.  · Increased sleepiness.  DIAGNOSIS  Your health care provider will perform a thorough physical and neurological exam. A CT scan or MRI may also be done. If there is blood on the scan, its color will help your health care provider determine how long the hemorrhage has been there.  TREATMENT  If the cause is an acute subdural hemorrhage, immediate treatment is needed. In many  cases an emergency surgery is performed to drain accumulated blood or to remove the blood clot. Sometimes steroid or diuretic medicines or controlled breathing through a ventilator is needed to decrease pressure in the brain. This is especially true if there is any swelling of the brain.  If the cause is a chronic subdural hemorrhage, treatment depends on a variety of factors. Sometimes no treatment is needed. If the subdural hematoma is small and causes minimal or no symptoms, you may be treated with bed rest, medicines, and observation. If the hemorrhage is large or if you have neurological symptoms, an emergency surgery is usually needed to remove the blood clot.  People who develop a subdural hemorrhage are at risk of developing seizures, even after the subdural hematoma has been treated. You may be prescribed an anti-seizure (anticonvulsant) medicine for a year or longer.  HOME CARE INSTRUCTIONS  · Only take medicines as directed by your health care provider.  · Rest if directed by your health care provider.  · Keep all follow-up appointments with your health care provider.  · If you play a contact sport such as football, hockey or soccer and you experienced a significant head injury, allow enough time for healing (up to 15 days) before you start playing again. A repeated injury that occurs during this fragile repair period is likely to result in hemorrhage. This is called the second impact syndrome.  SEEK IMMEDIATE MEDICAL CARE IF:  ·   You fall or experience minor trauma to your head and you are taking blood thinners. If you are on any blood thinners even a very small injury can cause a subdural hematoma. You should not hesitate to seek medical attention regardless of how minor you think your symptoms are.  · You experience a head injury and have:  ¨ Drowsiness or a decrease in alertness.  ¨ Confusion or forgetfulness.  ¨ Slurred speech.  ¨ Irrational or aggressive behavior.  ¨ Numbness or paralysis in any part  of the body.  ¨ A feeling of being sick to your stomach (nauseous) or you throw up (vomit).  ¨ Difficulty walking or poor coordination.  ¨ Double vision.  ¨ Seizures.  ¨ A bleeding disorder.  ¨ A history of heavy alcohol use.  ¨ Clear fluid draining from your nose or ears.  ¨ Personality changes.  ¨ Difficulty thinking.  ¨ Worsening symptoms.  MAKE SURE YOU:  · Understand these instructions.  · Will watch your condition.  · Will get help right away if you are not doing well or get worse.  FOR MORE INFORMATION  National Institute of Neurological Disorders and Stroke: www.ninds.nih.gov  American Association of Neurological Surgeons: www.neurosurgerytoday.org  American Academy of Neurology (AAN): www.aan.com  Brain Injury Association of America: www.biausa.org  Document Released: 02/10/2004 Document Revised: 01/13/2013 Document Reviewed: 09/25/2012  [employer]® Patient Information ©2015 [employer]. This information is not intended to replace advice given to you by your health care provider. Make sure you discuss any questions you have with your health care provider.
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
## 2013-10-03 NOTE — Progress Notes (Signed)
Discharge instructions and education provided to patient and spouse.  Both verbalized understanding.  Patient discharged to home, transferred via wheelchair, accomp by RN.

## 2013-10-12 ENCOUNTER — Other Ambulatory Visit: Payer: Self-pay | Admitting: Neurosurgery

## 2013-10-12 DIAGNOSIS — I6200 Nontraumatic subdural hemorrhage, unspecified: Secondary | ICD-10-CM

## 2013-10-20 ENCOUNTER — Ambulatory Visit
Admission: RE | Admit: 2013-10-20 | Discharge: 2013-10-20 | Disposition: A | Discharge status: Home / Self Care | Payer: 59 | Source: Ambulatory Visit | Attending: Neurosurgery | Admitting: Neurosurgery

## 2013-10-20 DIAGNOSIS — I6200 Nontraumatic subdural hemorrhage, unspecified: Secondary | ICD-10-CM

## 2013-11-16 ENCOUNTER — Other Ambulatory Visit: Payer: Self-pay | Admitting: Neurosurgery

## 2013-11-16 DIAGNOSIS — I6200 Nontraumatic subdural hemorrhage, unspecified: Secondary | ICD-10-CM

## 2013-11-23 ENCOUNTER — Ambulatory Visit
Admission: RE | Admit: 2013-11-23 | Discharge: 2013-11-23 | Disposition: A | Discharge status: Home / Self Care | Payer: 59 | Source: Ambulatory Visit | Attending: Neurosurgery | Admitting: Neurosurgery

## 2013-11-23 DIAGNOSIS — I6200 Nontraumatic subdural hemorrhage, unspecified: Secondary | ICD-10-CM

## 2014-09-01 ENCOUNTER — Other Ambulatory Visit (HOSPITAL_BASED_OUTPATIENT_CLINIC_OR_DEPARTMENT_OTHER): Payer: Self-pay | Admitting: Neurosurgery

## 2014-09-01 DIAGNOSIS — G44309 Post-traumatic headache, unspecified, not intractable: Secondary | ICD-10-CM

## 2014-09-17 IMAGING — CR DG TIBIA/FIBULA 2V*L*
3 series · 3 of 3 positions shown · non-contrast
Comparison: None.

CLINICAL DATA: Left lower leg pain.

EXAM:
LEFT TIBIA AND FIBULA - 2 VIEW

[AP]
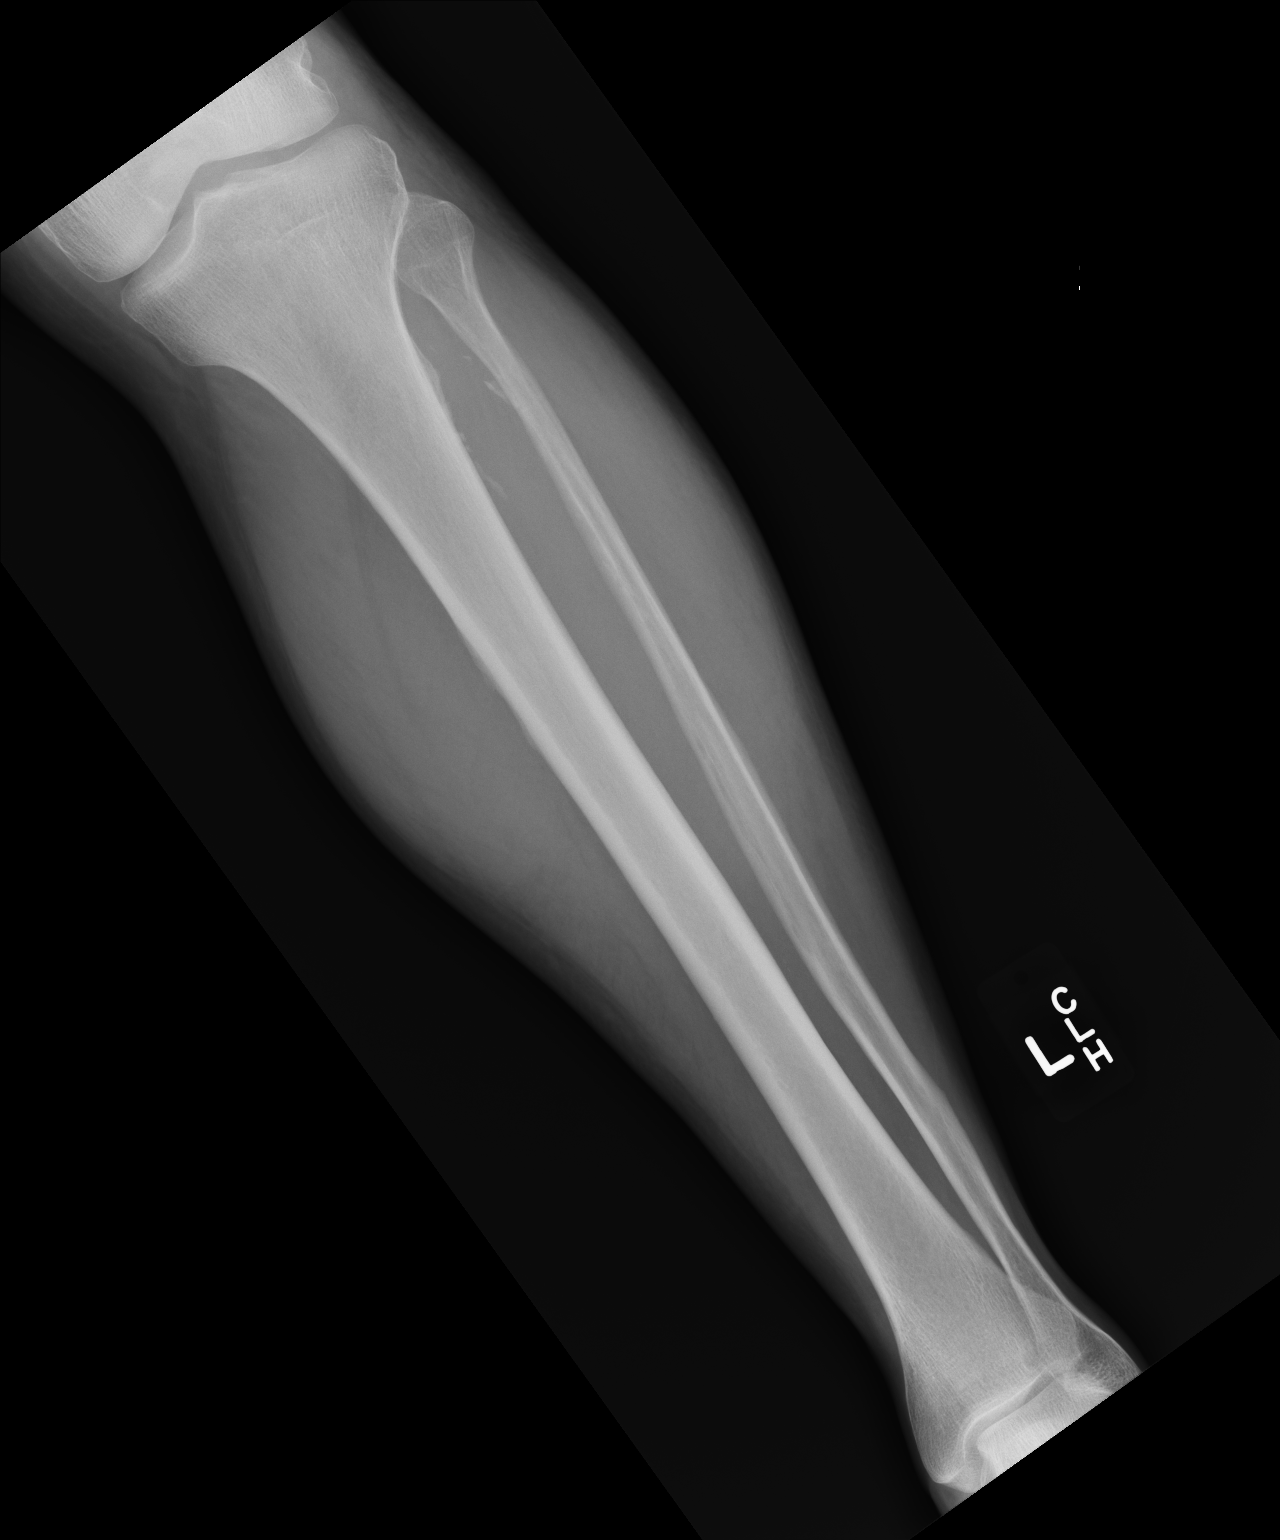

[lateral (1 of 2)]
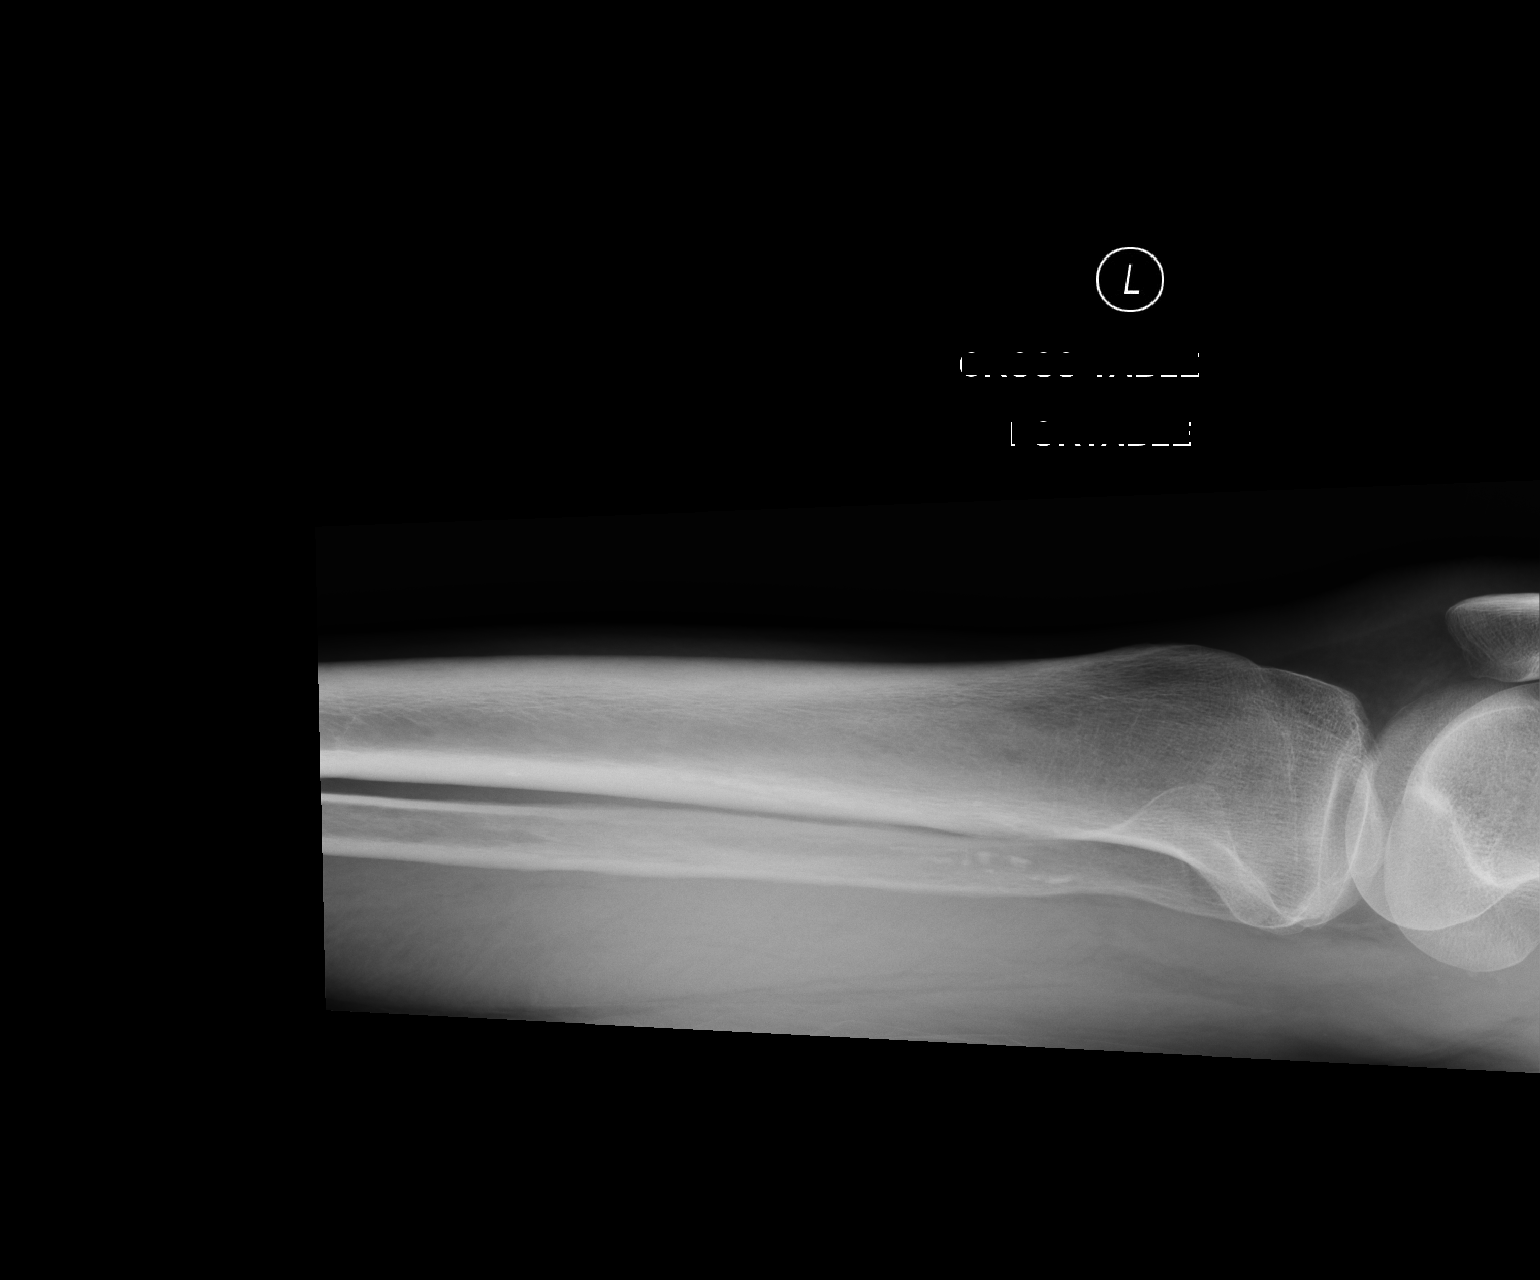

[lateral (2 of 2)]
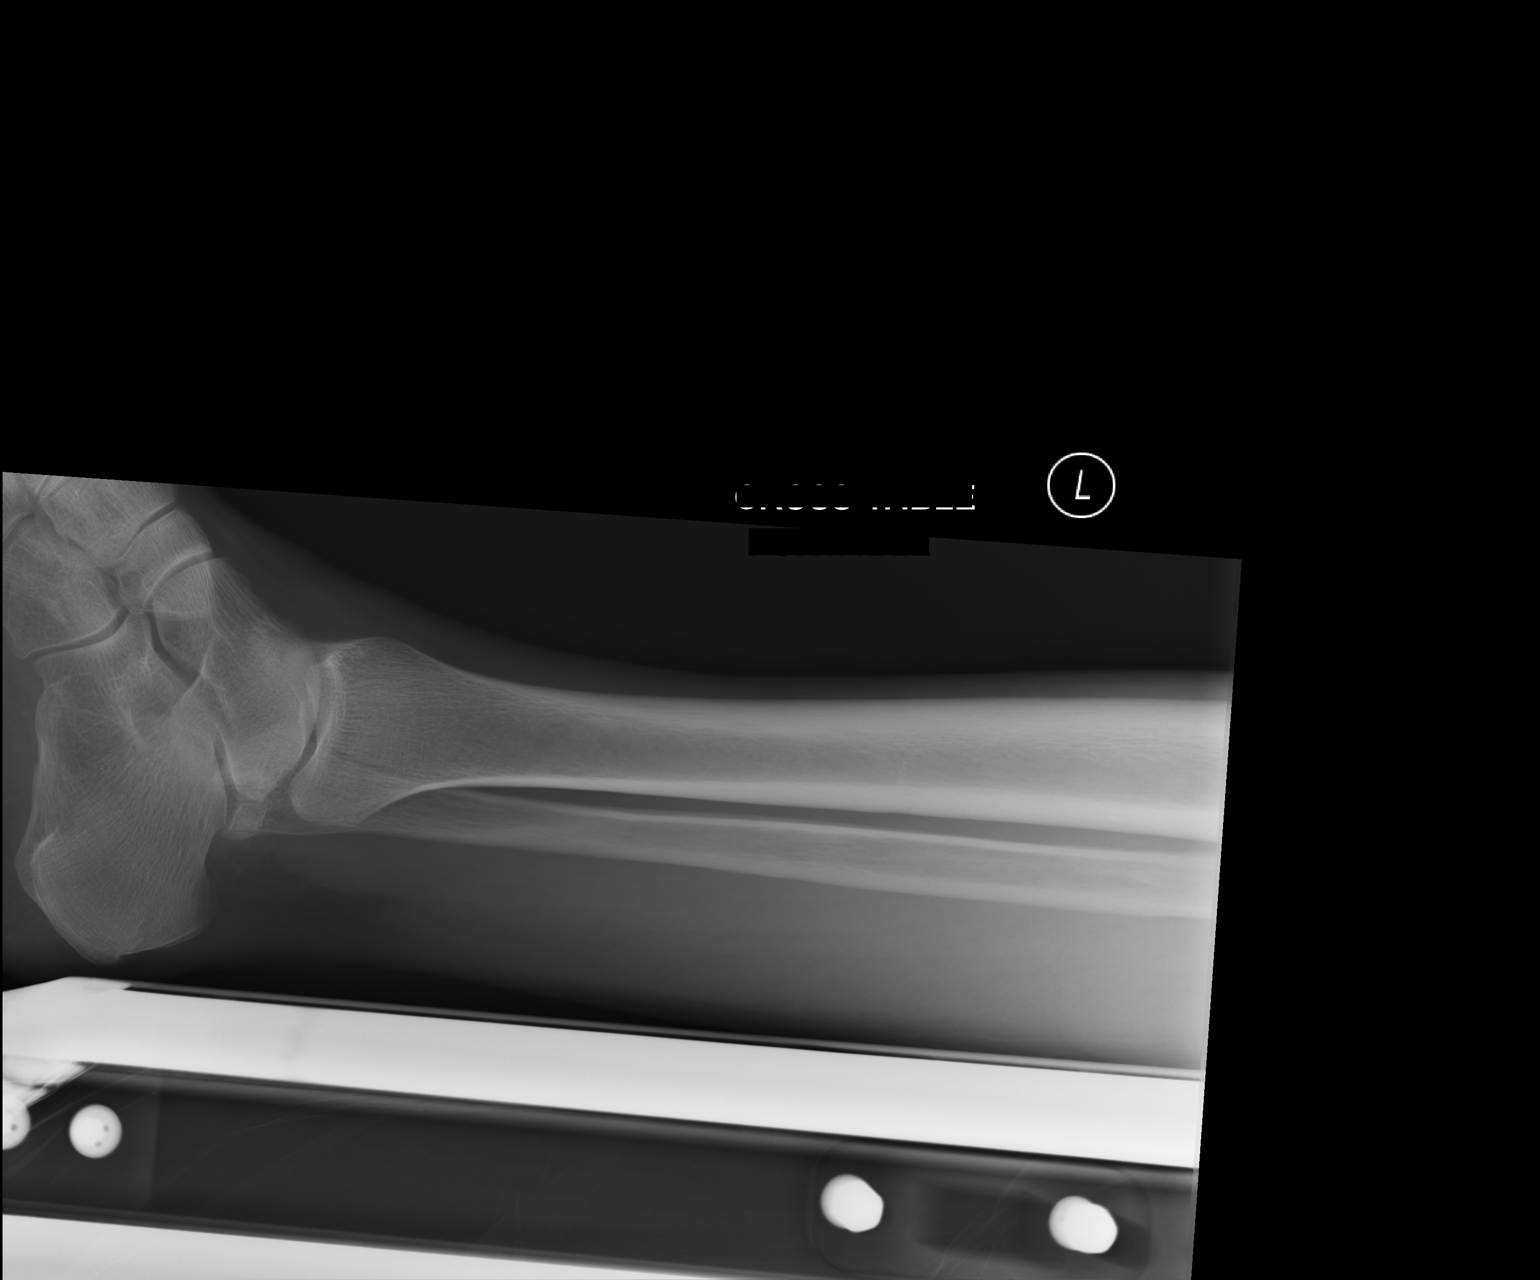

[3 of 3 positions shown; findings below may reference images not displayed]

FINDINGS: There is no evidence of fracture or dislocation. Minimal periostitis
is seen involving the proximal shafts of the tibia and fibula which
most likely is chronic. Soft tissues are unremarkable.
IMPRESSION: No significant abnormality seen in the left tibia or fibula.

## 2014-10-05 IMAGING — CT CT HEAD W/O CM
2 series · 16 of 30 positions shown, 18 images · non-contrast
Comparison: 10/03/2013 and previous

CLINICAL DATA: Followup subdural hematoma

EXAM:
CT HEAD WITHOUT CONTRAST
TECHNIQUE: Contiguous axial images were obtained from the base of the skull
through the vertex without intravenous contrast.

[Series 3: head bone · axial · 0.49mm/px · z∈[+15,+141]mm · 8 of 64 slices shown]
[im 7/64  bone]
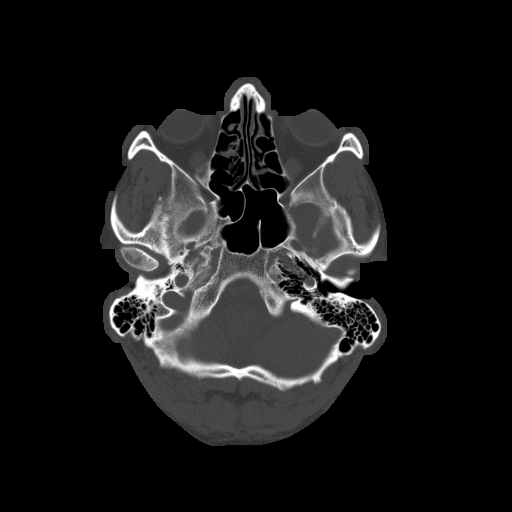
[im 14/64  bone]
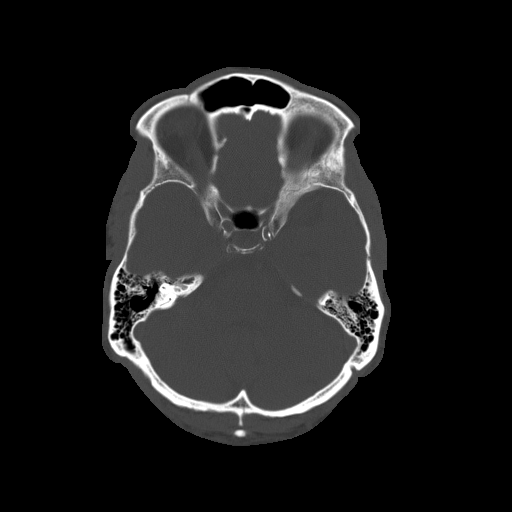
[im 20/64  bone]
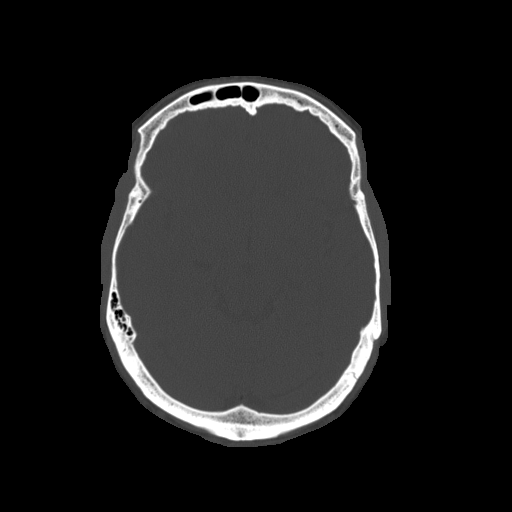
[im 27/64  bone]
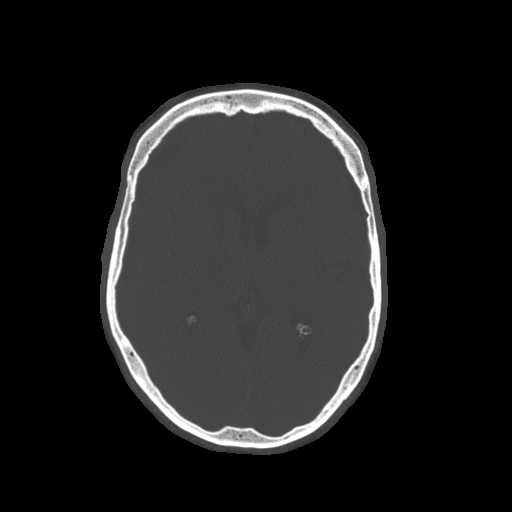
[im 37/64  bone]
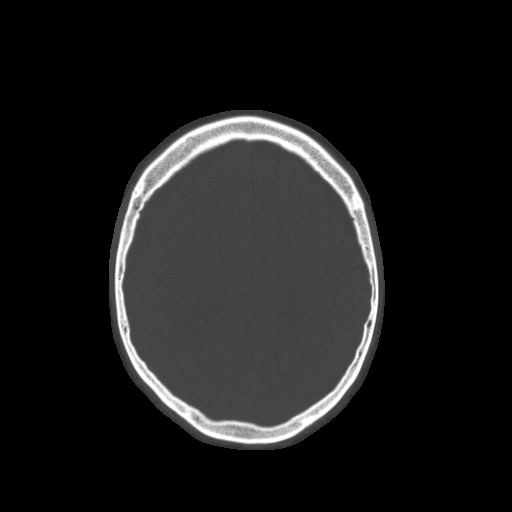
[im 44/64  bone]
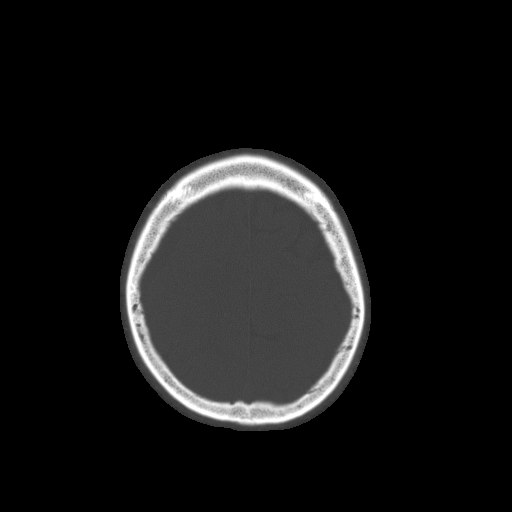
[im 50/64  bone]
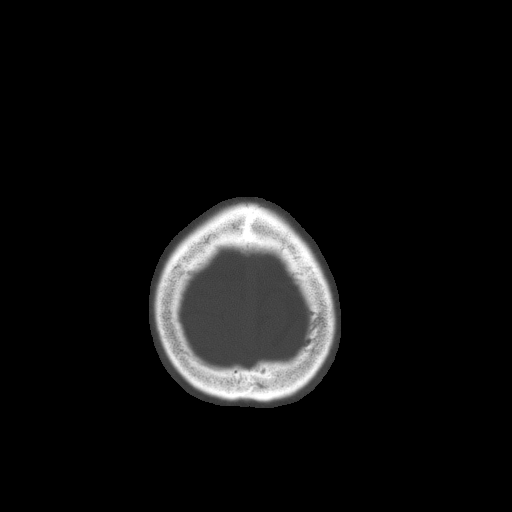
[im 57/64  bone]
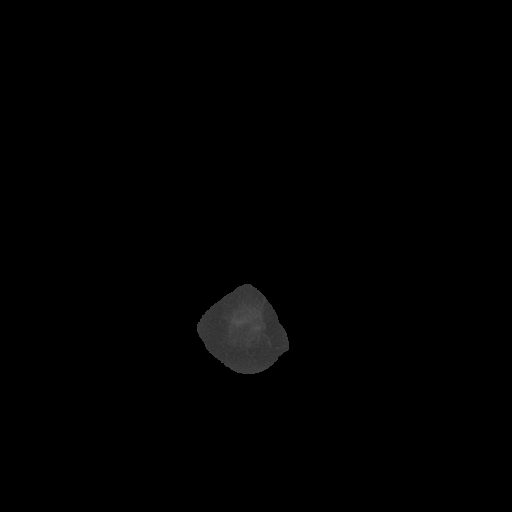

[Series 32: 3d filtered head w/o · axial · non-contrast · 0.49mm/px · z∈[+16,+137]mm · 8 of 32 slices shown, 10 images]
[im 4/32  brain]
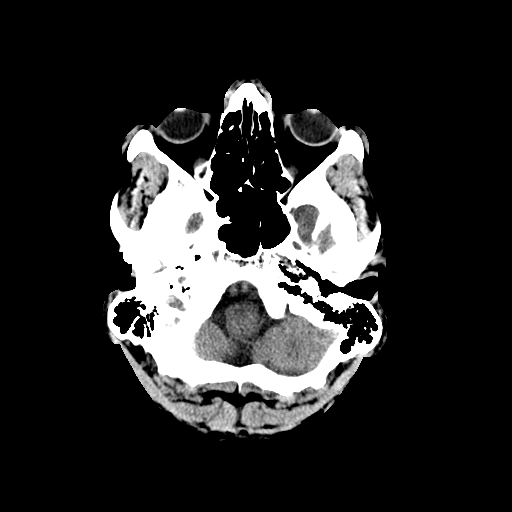
[im 4/32  bone]
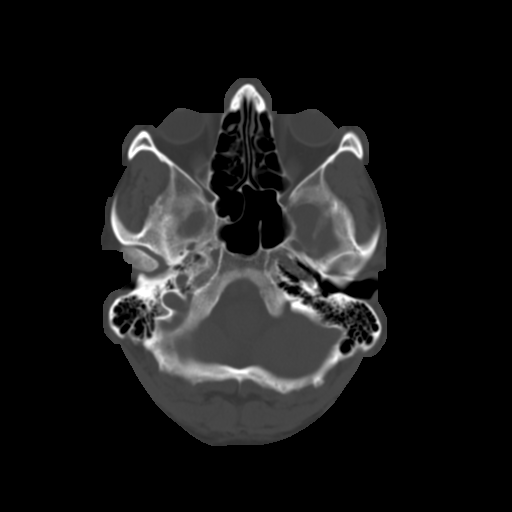
[im 7/32  brain]
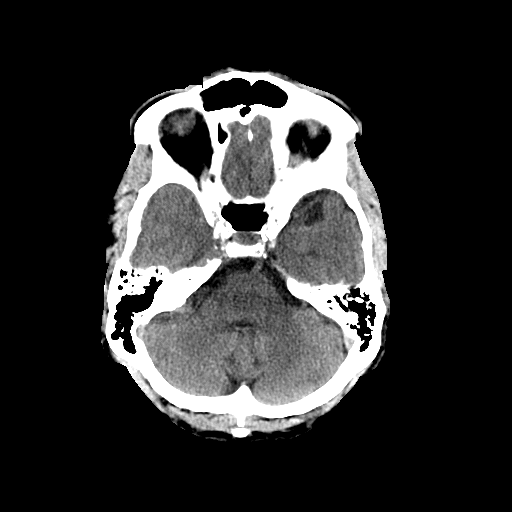
[im 11/32  brain]
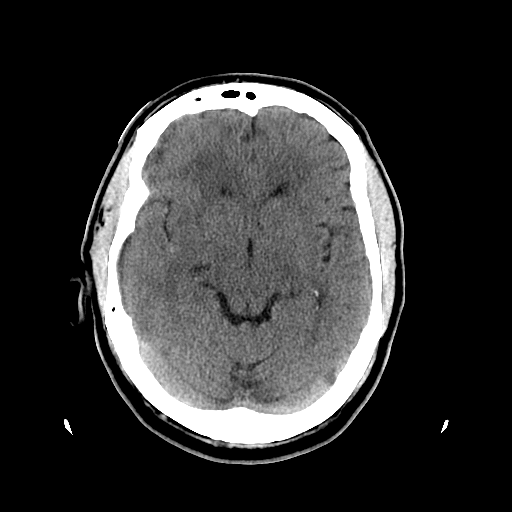
[im 14/32  brain]
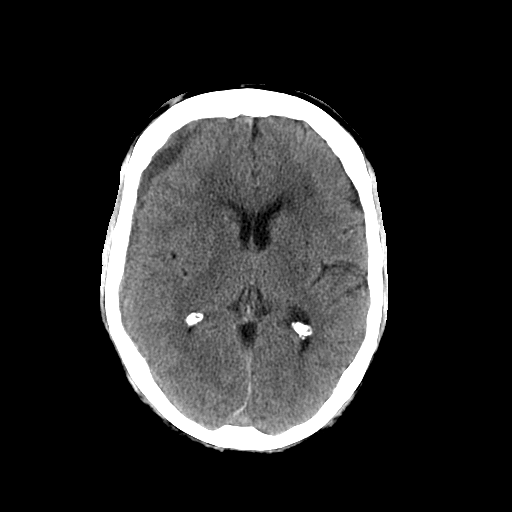
[im 18/32  brain]
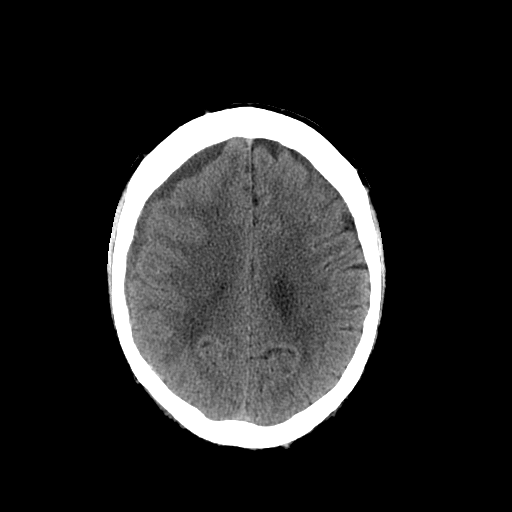
[im 18/32  bone]
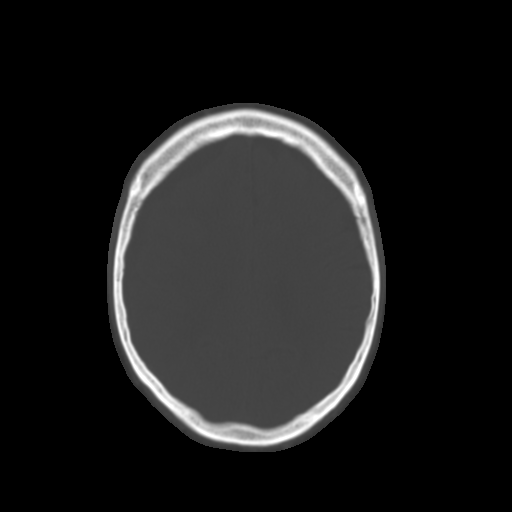
[im 21/32  brain]
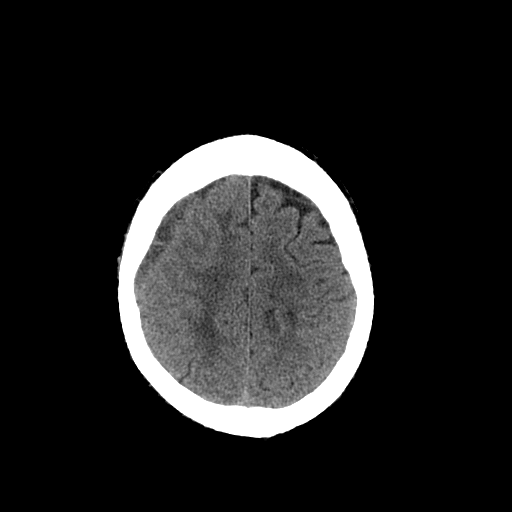
[im 25/32  brain]
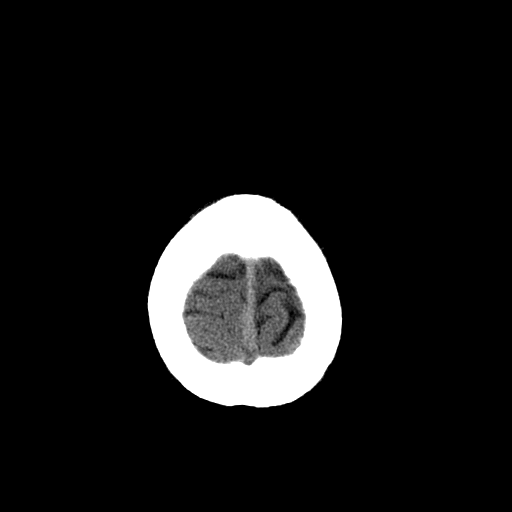
[im 28/32  brain]
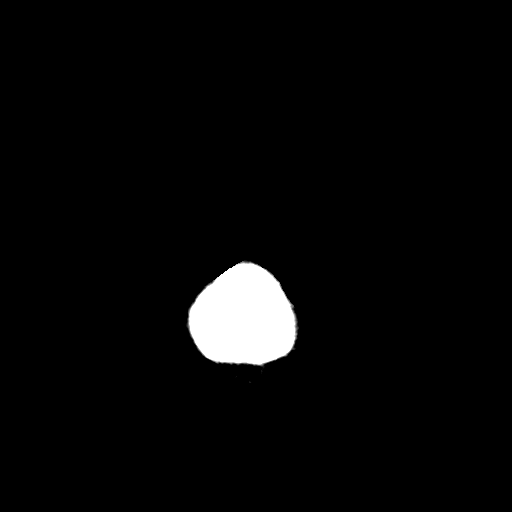

[16 of 30 positions shown; findings below may reference images not displayed]

FINDINGS: No evidence of recent or recurrent bleeding. No abnormality seen in
the posterior fossa. Previously seen subdural hematoma along the
convexity on the right has lost its density. Component along the
lateral convexity is reduced in thickness from 6 mm to 4 mm.
Low-density subdural fluid has migrated anteriorly, where the
maximal thickness is 11 mm. Mild mass effect upon the frontal lobe.
Right-to-left shift of 2 mm. No hydrocephalus. No intraparenchymal
hemorrhage. Mild chronic small-vessel changes again noted affecting
the deep white matter.
IMPRESSION: No additional bleeding or acute bleeding. Low-density subdural
material on the right has migrated anteriorly, thickest in the
frontal region measuring 11 mm. Mild mass effect upon the right
frontal lobe. Right-to-left shift of 2 mm.
